# Patient Record
Sex: Male | Born: 1980 | Race: Black or African American | Hispanic: No | Marital: Married
Health system: Southern US, Community
[De-identification: ages and names within clinical notes are randomized; demographics above are authoritative.]

---

## 2021-01-03 ENCOUNTER — Emergency Department (HOSPITAL_COMMUNITY): Payer: Commercial Managed Care - PPO

## 2021-01-03 ENCOUNTER — Emergency Department (HOSPITAL_COMMUNITY)
Admission: EM | Admit: 2021-01-03 | Discharge: 2021-01-04 | Disposition: A | Discharge status: Home / Self Care | Payer: Commercial Managed Care - PPO | Attending: Emergency Medicine | Admitting: Emergency Medicine

## 2021-01-03 DIAGNOSIS — R0602 Shortness of breath: Secondary | ICD-10-CM | POA: Insufficient documentation

## 2021-01-03 DIAGNOSIS — R079 Chest pain, unspecified: Secondary | ICD-10-CM | POA: Diagnosis present

## 2021-01-03 LAB — CBC
HCT: 42.9 % (ref 39.0–52.0)
Hemoglobin: 14.3 g/dL (ref 13.0–17.0)
MCH: 30.6 pg (ref 26.0–34.0)
MCHC: 33.3 g/dL (ref 30.0–36.0)
MCV: 91.7 fL (ref 80.0–100.0)
Platelets: 338 10*3/uL (ref 150–400)
RBC: 4.68 MIL/uL (ref 4.22–5.81)
RDW: 11.9 % (ref 11.5–15.5)
WBC: 6.7 10*3/uL (ref 4.0–10.5)
nRBC: 0 % (ref 0.0–0.2)

## 2021-01-03 LAB — BASIC METABOLIC PANEL
Anion gap: 8 (ref 5–15)
BUN: 12 mg/dL (ref 6–20)
CO2: 25 mmol/L (ref 22–32)
Calcium: 9.3 mg/dL (ref 8.9–10.3)
Chloride: 102 mmol/L (ref 98–111)
Creatinine, Ser: 1.21 mg/dL (ref 0.61–1.24)
GFR, Estimated: >60 mL/min (ref >60)
Glucose, Bld: 114 mg/dL — ABNORMAL HIGH (ref 70–99)
Potassium: 3.5 mmol/L (ref 3.5–5.1)
Sodium: 135 mmol/L (ref 135–145)

## 2021-01-03 LAB — TROPONIN I (HIGH SENSITIVITY): Troponin I (High Sensitivity): 4 ng/L (ref <18)

## 2021-01-03 NOTE — ED Triage Notes (Addendum)
Pt reports intermittent chest pain & SOB Starting about 3 days ago. Pt denies N/V/D, Dizziness and cardiac hx

## 2021-01-04 LAB — TROPONIN I (HIGH SENSITIVITY): Troponin I (High Sensitivity): 4 ng/L (ref <18)

## 2021-01-04 NOTE — ED Provider Notes (Signed)
[facility] EMERGENCY DEPARTMENT Provider Note   CSN: [id number] Arrival date & time: 01/03/21  2119     History Chief Complaint  Patient presents with  . Chest Pain    Travis Cooper is a 40 y.o. male.  The history is provided by the patient and medical records.    40 year old male here with chest pain.  This has been intermittent for about 3 days now with associated intermittent SOB.  He states symptoms are random in onset, not associated with exertion or other activity.  No diaphoresis, nausea, vomiting, dizziness, feelings of syncope.  No palpitations.  No history of cardiac disease.  He is not a smoker.  Denies any recent cough, congestion, fever, etc.  No past medical history on file.  There are no problems to display for this patient.     No family history on file.     Home Medications Prior to Admission medications   Not on File    Allergies    Patient has no known allergies.  Review of Systems   Review of Systems  Cardiovascular: Positive for chest pain.  All other systems reviewed and are negative.   Physical Exam Updated Vital Signs BP 126/80 (BP Location: Left Arm)   Pulse 90   Temp (!) 97.5 F (36.4 C) (Oral)   Resp 20   SpO2 100%   Physical Exam Vitals and nursing note reviewed.  Constitutional:      Appearance: He is well-developed.     Comments: Sleeping, awoken for exam  HENT:     Head: Normocephalic and atraumatic.  Eyes:     Conjunctiva/sclera: Conjunctivae normal.     Pupils: Pupils are equal, round, and reactive to light.  Cardiovascular:     Rate and Rhythm: Normal rate and regular rhythm.     Heart sounds: Normal heart sounds.  Pulmonary:     Effort: Pulmonary effort is normal.     Breath sounds: Normal breath sounds. No wheezing or rhonchi.     Comments: Lungs CTAB, no distress noted Abdominal:     General: Bowel sounds are normal.     Palpations: Abdomen is soft.  Musculoskeletal:        General:  Normal range of motion.     Cervical back: Normal range of motion.  Skin:    General: Skin is warm and dry.  Neurological:     Mental Status: He is alert and oriented to person, place, and time.     ED Results / Procedures / Treatments   Labs (all labs ordered are listed, but only abnormal results are displayed) Labs Reviewed  BASIC METABOLIC PANEL - Abnormal; Notable for the following components:      Result Value   Glucose, Bld 114 (*)    All other components within normal limits  CBC  TROPONIN I (HIGH SENSITIVITY)  TROPONIN I (HIGH SENSITIVITY)    EKG EKG Interpretation  Date/Time:  Wednesday January 03 2021 21:23:46 EDT Ventricular Rate:  95 PR Interval:  142 QRS Duration: 82 QT Interval:  352 QTC Calculation: 442 R Axis:   27 Text Interpretation: Normal sinus rhythm Normal ECG Confirmed by Geoffery Lyons ([id number]) on 01/04/2021 12:54:13 AM   Radiology DG Chest 2 View  Result Date: 01/03/2021 CLINICAL DATA:  Intermittent chest pain and shortness of breath for 3 days EXAM: CHEST - 2 VIEW COMPARISON:  None. FINDINGS: The heart size and mediastinal contours are within normal limits. Both lungs are clear. The visualized skeletal  structures are unremarkable. IMPRESSION: No active cardiopulmonary disease. Electronically Signed   By: Sharlet Salina M.D.   On: 01/03/2021 22:06    Procedures Procedures   Medications Ordered in ED Medications - No data to display  ED Course  I have reviewed the triage vital signs and the nursing notes.  Pertinent labs & imaging results that were available during my care of the patient were reviewed by me and considered in my medical decision making (see chart for details).    MDM Rules/Calculators/A&P  40 year old male presenting to the ED with intermittent chest pain over the past 3 days.  Symptoms are random in onset without noted alleviating or exacerbating factors.  He is otherwise healthy without any known cardiac disease.  EKG is  nonischemic.  Labs are reassuring including troponin x2.  Chest x-ray is clear.  Patient without active chest pain here in ED.  Lower suspicion for ACS.  PERC negative, feel PE less likely as well.  Feel he is stable for discharge home.  Does not currently have PCP but is insured, discussed how to go about finding new PCP in network to set up appointment with.  Return here for new concerns.  Final Clinical Impression(s) / ED Diagnoses Final diagnoses:  Chest pain, unspecified type    Rx / DC Orders ED Discharge Orders    None       Garlon Hatchet, PA-C 01/04/21 0159    Geoffery Lyons, MD 01/04/21 0700

## 2021-01-04 NOTE — Discharge Instructions (Signed)
Your cardiac work-up today was normal. We recommend that you follow-up with a primary care doctor. 800 number on back of your insurance card can help you find a local provider in your network. Return here for any new/acute changes.

## 2021-01-04 NOTE — ED Notes (Signed)
Pt verbalized understanding of d/c instructions, follow up and medications. Pt ambulatory to WR with steady gait. NAD.  

## 2021-09-17 IMAGING — CR DG CHEST 2V
2 series · 2 of 2 positions shown · non-contrast
Comparison: None.

CLINICAL DATA: Intermittent chest pain and shortness of breath for
3 days

EXAM:
CHEST - 2 VIEW

[chest pa]
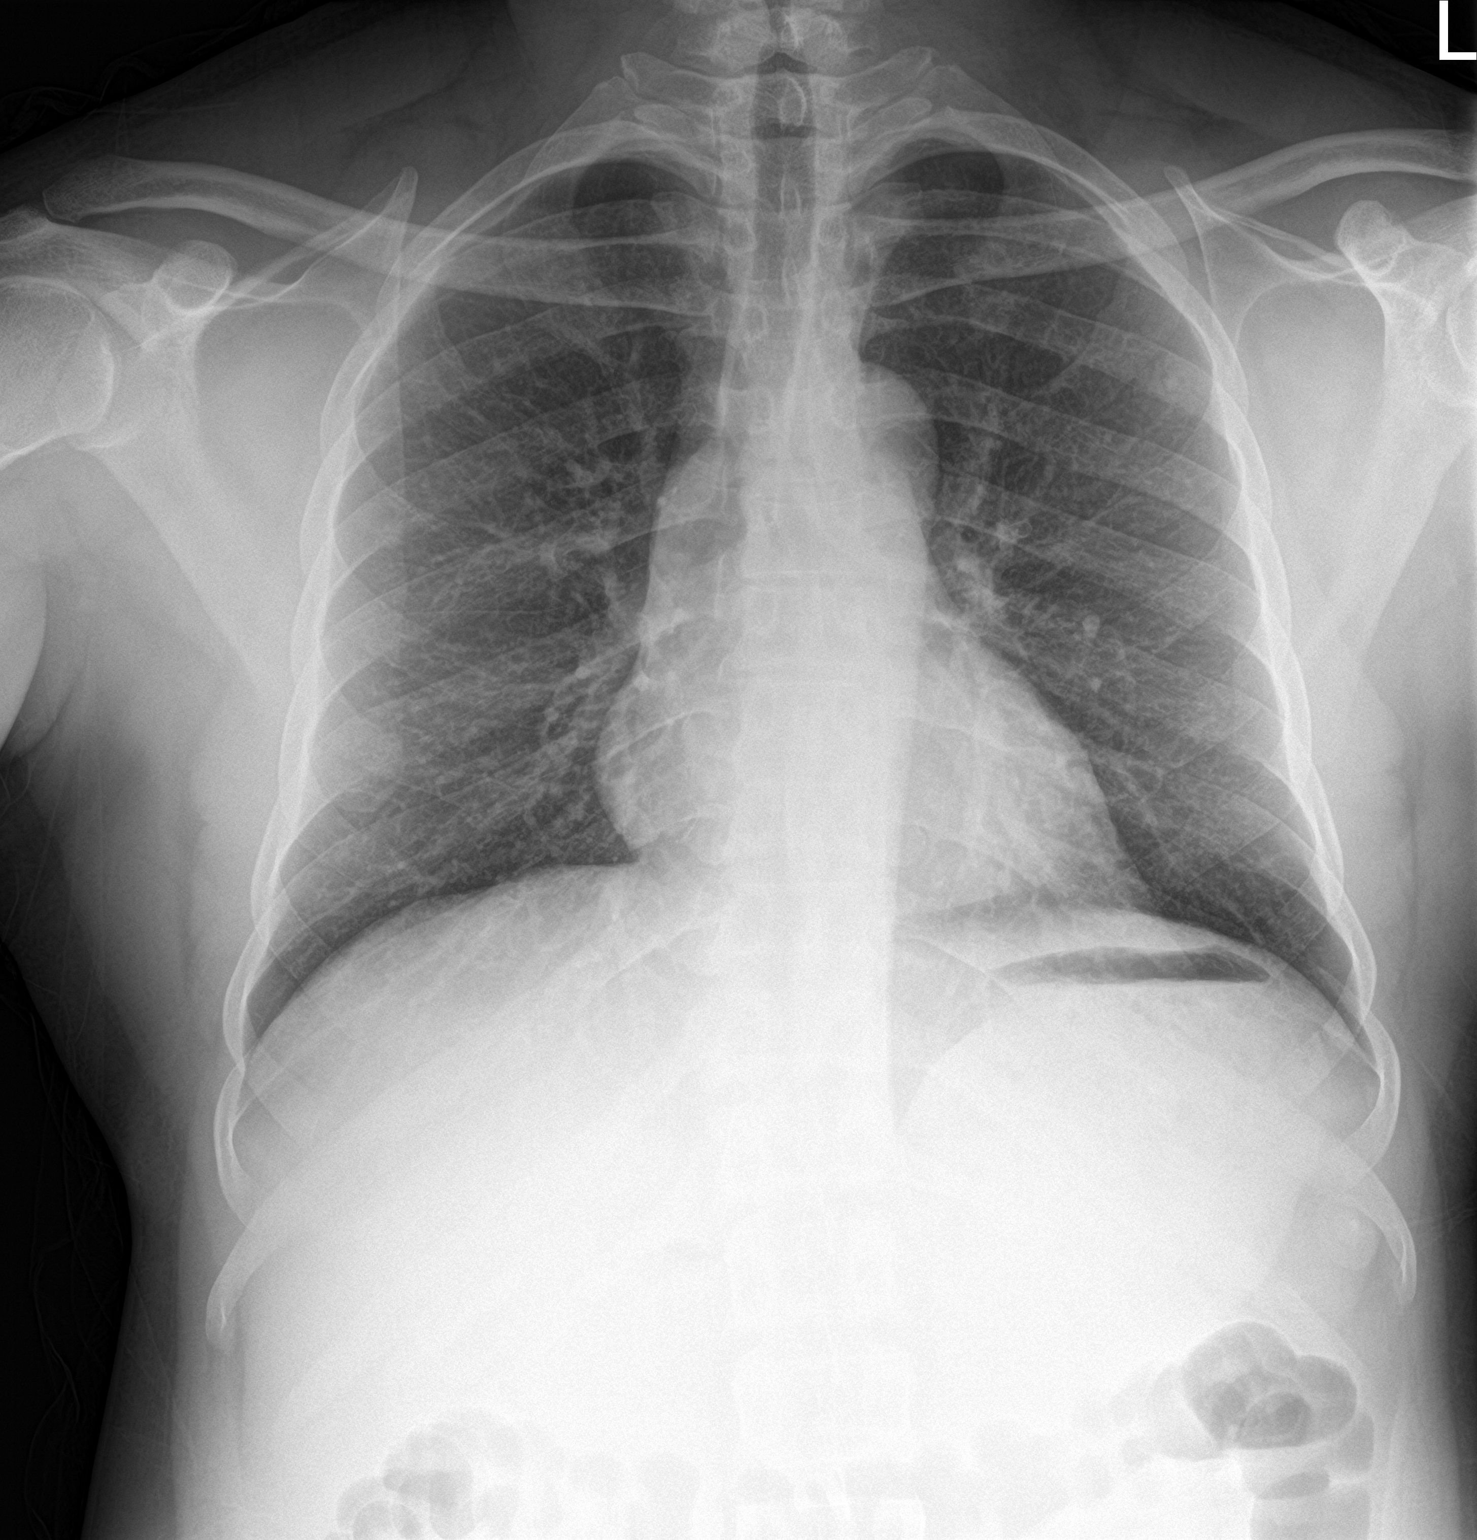

[chest lat]
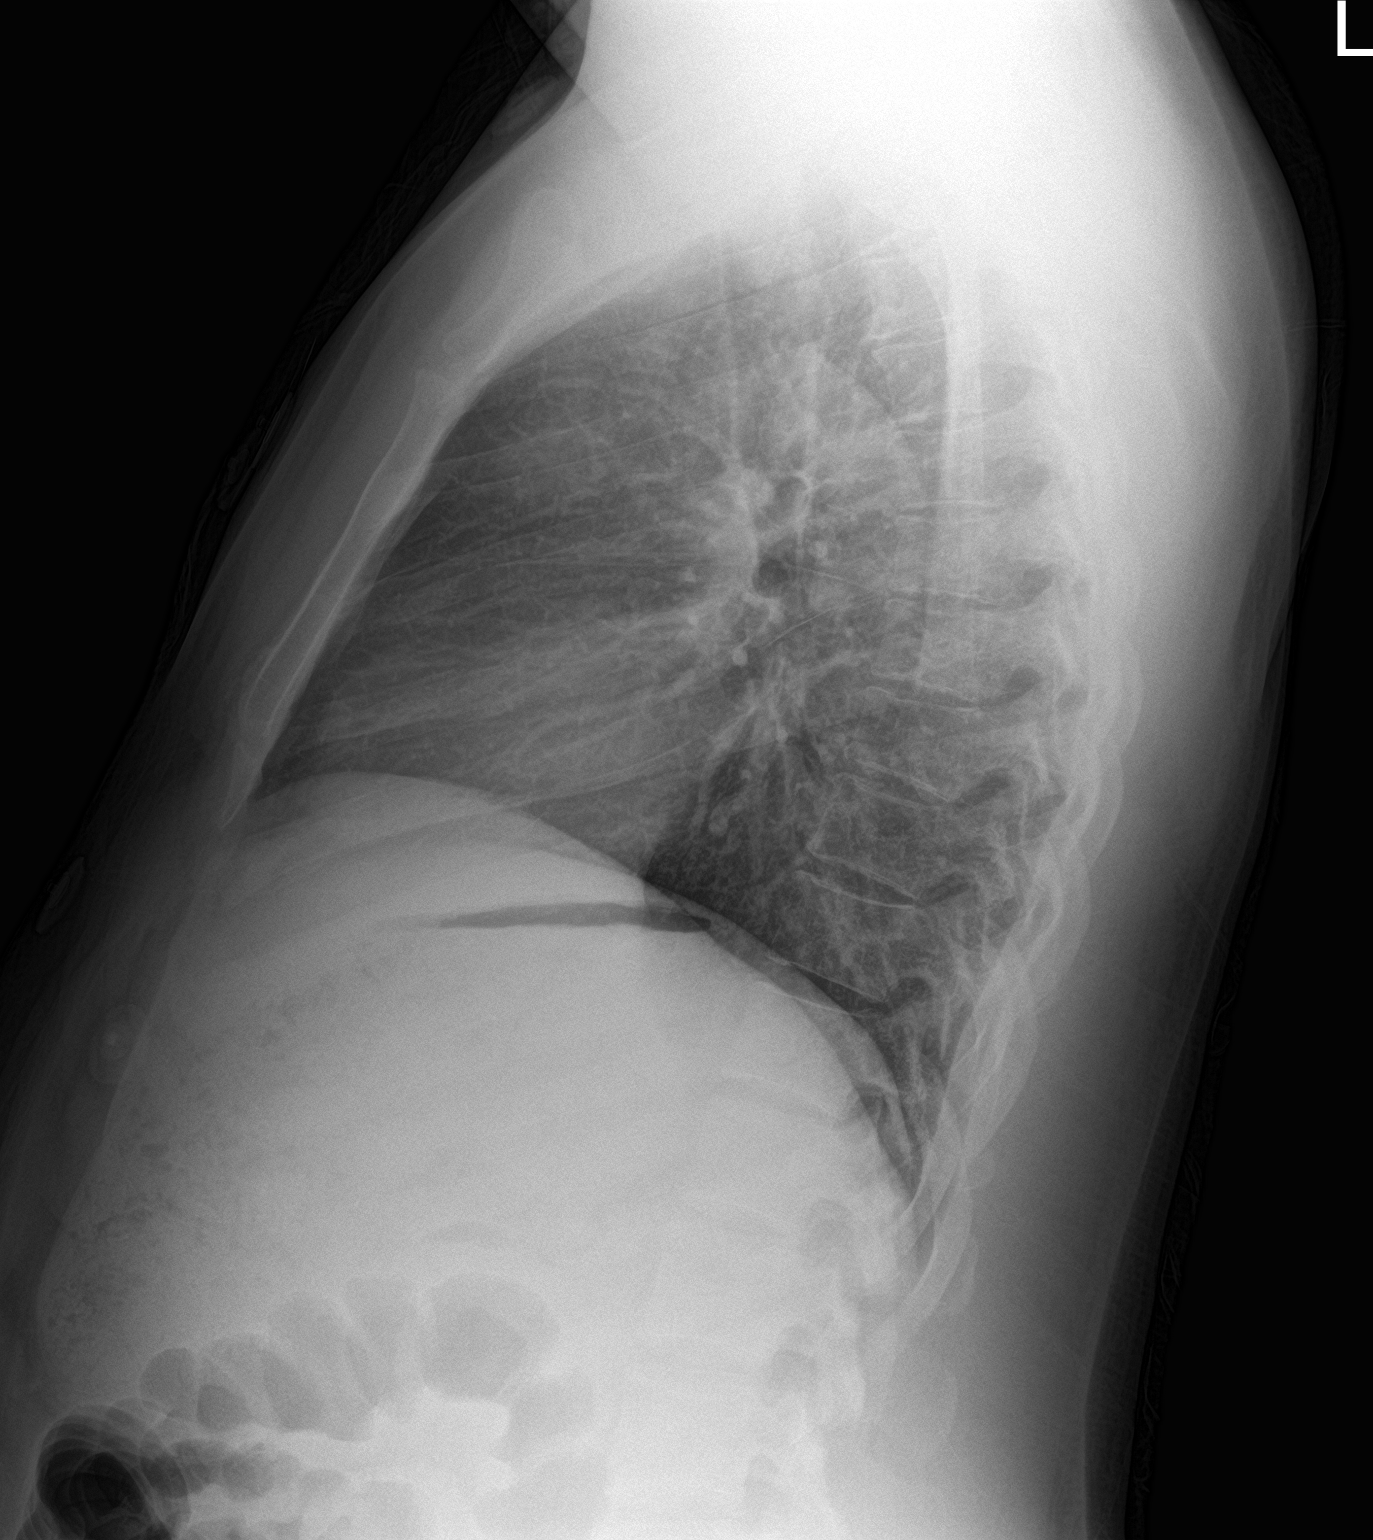

[2 of 2 positions shown; findings below may reference images not displayed]

FINDINGS: The heart size and mediastinal contours are within normal limits.
Both lungs are clear. The visualized skeletal structures are
unremarkable.
IMPRESSION: No active cardiopulmonary disease.
